# Patient Record
Sex: Female | Born: 2005 | Race: Black or African American | Hispanic: No | Marital: Single | Smoking: Former smoker
Health system: Southern US, Community
[De-identification: ages and names within clinical notes are randomized; demographics above are authoritative.]

## PROBLEM LIST (undated history)

## (undated) DIAGNOSIS — F32A Depression, unspecified: Secondary | ICD-10-CM

## (undated) DIAGNOSIS — R519 Headache, unspecified: Secondary | ICD-10-CM

## (undated) DIAGNOSIS — F988 Other specified behavioral and emotional disorders with onset usually occurring in childhood and adolescence: Secondary | ICD-10-CM

## (undated) DIAGNOSIS — F4310 Post-traumatic stress disorder, unspecified: Secondary | ICD-10-CM

## (undated) DIAGNOSIS — R45851 Suicidal ideations: Secondary | ICD-10-CM

## (undated) DIAGNOSIS — F329 Major depressive disorder, single episode, unspecified: Secondary | ICD-10-CM

---
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
## 2024-01-28 ENCOUNTER — Ambulatory Visit
Admission: EM | Admit: 2024-01-28 | Discharge: 2024-01-28 | Disposition: A | Discharge status: Home / Self Care | Payer: MEDICAID | Attending: Family Medicine | Admitting: Family Medicine

## 2024-01-28 DIAGNOSIS — J988 Other specified respiratory disorders: Secondary | ICD-10-CM | POA: Diagnosis not present

## 2024-01-28 DIAGNOSIS — Z1152 Encounter for screening for COVID-19: Secondary | ICD-10-CM

## 2024-01-28 DIAGNOSIS — B9789 Other viral agents as the cause of diseases classified elsewhere: Secondary | ICD-10-CM | POA: Diagnosis not present

## 2024-01-28 LAB — POC SOFIA SARS ANTIGEN FIA: SARS Coronavirus 2 Ag: NEGATIVE

## 2024-01-28 MED ORDER — PROMETHAZINE-DM 6.25-15 MG/5ML PO SYRP: 5.0000 mL | ORAL_SOLUTION | Freq: Three times a day (TID) | ORAL | 0 refills | Status: DC | PRN

## 2024-01-28 MED ORDER — PSEUDOEPHEDRINE HCL 30 MG PO TABS: 30.0000 mg | ORAL_TABLET | Freq: Three times a day (TID) | ORAL | 0 refills | Status: DC | PRN

## 2024-01-28 MED ORDER — CETIRIZINE HCL 10 MG PO TABS
10.0000 mg | ORAL_TABLET | Freq: Every day | ORAL | 0 refills | Status: DC
Start: 2024-01-28 — End: 2024-03-22

## 2024-01-28 MED ORDER — BENZONATATE 100 MG PO CAPS
100.0000 mg | ORAL_CAPSULE | Freq: Three times a day (TID) | ORAL | 0 refills | Status: DC | PRN
Start: 2024-01-28 — End: 2024-03-22

## 2024-01-28 MED ORDER — IBUPROFEN 600 MG PO TABS
600.0000 mg | ORAL_TABLET | Freq: Four times a day (QID) | ORAL | 0 refills | Status: DC | PRN
Start: 2024-01-28 — End: 2024-03-22

## 2024-01-28 NOTE — ED Provider Notes (Signed)
 [facility]  Note:  This document was prepared using Conservation officer, historic buildings and may include unintentional dictation errors.  MRN: [id number] DOB: May 09, 2005  Subjective:   [name] is a 18 y.o. female presenting for 3-day history of malaise, fatigue, loss of taste and smell, headaches, stuffy nose and ears, persistent coughing, neck tension.  No chest pain, shortness of breath or wheezing.  No asthma.  Smokes marijuana a few times weekly.  No immunocompromising conditions.  No current facility-administered medications for this encounter.  Current Outpatient Medications:    Pseudoephedrine-APAP-DM (DAYQUIL PO), Take by mouth., Disp: , Rfl:    Allergies  Allergen Reactions   Other Anaphylaxis    peanuts   Peanut-Containing Drug Products Anaphylaxis, Shortness Of Breath, Swelling and Other (See Comments)    Peanut butter    Past Medical History:  Diagnosis Date   ADD (attention deficit disorder)    Depression    Headache    PTSD (post-traumatic stress disorder)    Suicidal ideation      History reviewed. No pertinent surgical history.  Family History  Problem Relation Age of Onset   Depression Mother    Alcohol abuse Father    Hypertension Other    Diabetes Other    CAD Other     Social History   Tobacco Use   Smoking status: Former    Current packs/day: 0.25    Average packs/day: 0.3 packs/day for 3.3 years (0.8 ttl pk-yrs)    Types: Cigarettes    Start date: 09/26/2020   Smokeless tobacco: Never  Vaping Use   Vaping status: Former   Substances: Nicotine, Flavoring  Substance Use Topics   Alcohol use: Yes    Comment: occasionally   Drug use: Yes    Frequency: 3.0 times per week    Types: Marijuana    ROS   Objective:   Vitals: BP 128/84 (BP Location: Right Arm)   Pulse 92   Temp 98.3 F (36.8 C) (Oral)   Resp 16   LMP 01/11/2024 (Approximate)   SpO2 96%   Physical Exam Constitutional:      General:  She is not in acute distress.    Appearance: Normal appearance. She is well-developed and normal weight. She is not ill-appearing, toxic-appearing or diaphoretic.  HENT:     Head: Normocephalic and atraumatic.     Right Ear: Tympanic membrane, ear canal and external ear normal. No drainage or tenderness. No middle ear effusion. There is no impacted cerumen. Tympanic membrane is not erythematous or bulging.     Left Ear: Tympanic membrane, ear canal and external ear normal. No drainage or tenderness.  No middle ear effusion. There is no impacted cerumen. Tympanic membrane is not erythematous or bulging.     Nose: Congestion present. No rhinorrhea.     Mouth/Throat:     Mouth: Mucous membranes are moist. No oral lesions.     Pharynx: No pharyngeal swelling, oropharyngeal exudate, posterior oropharyngeal erythema or uvula swelling.     Tonsils: No tonsillar exudate or tonsillar abscesses.     Comments: Thick streaks of postnasal drainage overlying pharynx.  Eyes:     General: No scleral icterus.       Right eye: No discharge.        Left eye: No discharge.     Extraocular Movements: Extraocular movements intact.     Right eye: Normal extraocular motion.     Left eye: Normal extraocular motion.  Conjunctiva/sclera: Conjunctivae normal.  Cardiovascular:     Rate and Rhythm: Normal rate and regular rhythm.     Heart sounds: Normal heart sounds. No murmur heard.    No friction rub. No gallop.  Pulmonary:     Effort: Pulmonary effort is normal. No respiratory distress.     Breath sounds: No stridor. No wheezing, rhonchi or rales.  Chest:     Chest wall: No tenderness.  Musculoskeletal:     Cervical back: Normal range of motion and neck supple.  Lymphadenopathy:     Cervical: No cervical adenopathy.  Skin:    General: Skin is warm and dry.  Neurological:     General: No focal deficit present.     Mental Status: She is alert and oriented to person, place, and time.  Psychiatric:         Mood and Affect: Mood normal.        Behavior: Behavior normal.    Negative poc COVID 19.   Assessment and Plan :   PDMP not reviewed this encounter.  1. Viral respiratory infection   2. Encounter for screening for COVID-19    Deferred imaging given clear cardiopulmonary exam, hemodynamically stable vital signs.  Suspect viral URI, viral syndrome. Physical exam findings reassuring and vital signs stable for discharge. Advised supportive care, offered symptomatic relief. Counseled patient on potential for adverse effects with medications prescribed/recommended today, ER and return-to-clinic precautions discussed, patient verbalized understanding.     Christopher Savannah, [place] 01/28/24 8087

## 2024-01-28 NOTE — ED Triage Notes (Signed)
 Pt reports wants a COVID test, as she can smell or test x 2-3 days., Reports headache, tension in neck, stuffy eras, stuffy nose x 2-3 days. Dayquil, cough drops gives some relief.

## 2024-01-28 NOTE — Discharge Instructions (Addendum)
 We will manage this as a viral respiratory infection/illness. For sore throat or cough try using a honey-based tea. Use 3 teaspoons of honey with juice squeezed from half lemon. Place shaved pieces of ginger into 1/2-1 cup of water and warm over stove top. Then mix the ingredients and repeat every 4 hours as needed. Please take ibuprofen  600mg  every 6 hours with food alternating with OR taken together with Tylenol  500mg -650mg  every 6 hours for throat pain, fevers, aches and pains. Hydrate very well with at least 2 liters of water. Eat light meals such as soups (chicken and noodles, vegetable, chicken and wild rice).  Do not eat foods that you are allergic to.  Taking an antihistamine like Zyrtec (10mg  daily) can help against postnasal drainage, sinus congestion which can cause sinus pain, sinus headaches, throat pain, painful swallowing, coughing.  You can take this together with pseudoephedrine (Sudafed) at a dose of 30mg  3 times a day or twice daily as needed for the same kind of nasal drip, congestion.  Use cough medications as needed.

## 2024-02-28 ENCOUNTER — Ambulatory Visit
Admission: EM | Admit: 2024-02-28 | Discharge: 2024-02-28 | Disposition: A | Discharge status: Home / Self Care | Payer: MEDICAID | Attending: Family Medicine | Admitting: Family Medicine

## 2024-02-28 DIAGNOSIS — Z3201 Encounter for pregnancy test, result positive: Secondary | ICD-10-CM | POA: Diagnosis not present

## 2024-02-28 LAB — POCT URINE PREGNANCY: Preg Test, Ur: POSITIVE — AB

## 2024-02-28 NOTE — ED Provider Notes (Signed)
 [facility]    CSN: [id number] Arrival date & time: 02/28/24  1427      History   Chief Complaint Chief Complaint  Patient presents with   Possible Pregnancy    HPI [name] is a 18 y.o. female presents for menstrual issue.  Patient states her last menstrual period was September 14.  She is not on any birth control and is sexually active.  States she has been having some nausea, fatigue.  She has not been pregnant before.  States she did on positive home pregnancy test 2 to 3 days ago.  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 No pertinent surgical history.  OB History     Gravida  1   Para  0   Term  0   Preterm  0   AB  0   Living  0      SAB  0   IAB  0   Ectopic  0   Multiple  0   Live Births  0            Home Medications    Prior to Admission medications   Medication Sig Start Date End Date Taking? Authorizing Provider  benzonatate (TESSALON) 100 MG capsule Take 1 capsule (100 mg total) by mouth 3 (three) times daily as needed for cough. 01/28/24   Christopher Savannah, PA-C  cetirizine (ZYRTEC ALLERGY) 10 MG tablet Take 1 tablet (10 mg total) by mouth daily. 01/28/24   Christopher Savannah, PA-C  ibuprofen  (ADVIL ) 600 MG tablet Take 1 tablet (600 mg total) by mouth every 6 (six) hours as needed. 01/28/24   Christopher Savannah, PA-C  promethazine-dextromethorphan (PROMETHAZINE-DM) 6.25-15 MG/5ML syrup Take 5 mLs by mouth 3 (three) times daily as needed for cough. 01/28/24   Christopher Savannah, PA-C  pseudoephedrine (SUDAFED) 30 MG tablet Take 1 tablet (30 mg total) by mouth every 8 (eight) hours as needed for congestion. 01/28/24   Christopher Savannah, PA-C  Pseudoephedrine-APAP-DM (DAYQUIL PO) Take by mouth.    [provider]    Family History Family History  Problem Relation Age of Onset   Depression Mother    Alcohol abuse Father    Hypertension Other    Diabetes Other    CAD Other     Social History Social History   Tobacco Use   Smoking status: Former    Current packs/day: 0.25    Average packs/day: 0.3 packs/day for 3.4 years (0.9 ttl pk-yrs)    Types: Cigarettes    Start date: 09/26/2020   Smokeless tobacco: Never  Vaping Use   Vaping status: Former   Substances: Nicotine, Flavoring  Substance Use Topics   Alcohol use: Yes    Comment: occasionally   Drug use: Yes    Frequency: 3.0 times per week    Types: Marijuana     Allergies   Other and Peanut-containing drug products   Review of Systems Review of Systems  Genitourinary:  Positive for  menstrual problem.     Physical Exam Triage Vital Signs ED Triage Vitals  Encounter Vitals Group     BP 02/28/24 1445 114/64     Girls Systolic BP Percentile --      Girls Diastolic BP Percentile --      Boys Systolic BP Percentile --      Boys Diastolic BP Percentile --      Pulse Rate 02/28/24 1445 74     Resp 02/28/24 1445 16     Temp 02/28/24 1445 98.5 F (36.9 C)     Temp Source 02/28/24 1445 Oral     SpO2 02/28/24 1445 97 %     Weight --      Height --      Head Circumference --      Peak Flow --      Pain Score 02/28/24 1444 0     Pain Loc --      Pain Education --      Exclude from Growth Chart --    No data found.  Updated Vital Signs BP 114/64 (BP Location: Right Arm)   Pulse 74   Temp 98.5 F (36.9 C) (Oral)   Resp 16   LMP 01/10/2024 (Exact Date)   SpO2 97%   Visual Acuity Right Eye Distance:   Left Eye Distance:   Bilateral Distance:    Right Eye Near:   Left  Eye Near:    Bilateral Near:     Physical Exam Vitals and nursing note reviewed.  Constitutional:      Appearance: Normal appearance.  HENT:     Head: Normocephalic and atraumatic.  Eyes:     Pupils: Pupils are equal, round, and reactive to light.  Cardiovascular:     Rate and Rhythm: Normal rate.  Pulmonary:     Effort: Pulmonary effort is normal.  Skin:    General: Skin is warm and dry.  Neurological:     General: No focal deficit present.     Mental Status: She is alert and oriented to person, place, and time.  Psychiatric:        Mood and Affect: Mood normal.        Behavior: Behavior normal.      UC Treatments / Results  Labs (all labs ordered are listed, but only abnormal results are displayed) Labs Reviewed  POCT URINE PREGNANCY - Abnormal; Notable for the following components:      Result Value   Preg Test, Ur Positive (*)    All other components within normal limits    EKG   Radiology No results found.  Procedures Procedures (including critical care time)  Medications Ordered in UC Medications - No data to display  Initial Impression / Assessment and Plan / UC Course  I have reviewed the triage vital signs and the nursing notes.  Pertinent labs & imaging results that were available during my care of the patient were reviewed by me and considered in my medical decision making (see chart for details).     Positive urine hCG, reviewed with patient.  Advised to establish with OB/GYN for prenatal care.  Follow-up with PCP as needed. Final Clinical Impressions(s) / UC Diagnoses   Final diagnoses:  Positive urine pregnancy test     Discharge Instructions      Please establish with OB/GYN for your prenatal care.  Congratulations!    ED Prescriptions   None    PDMP not reviewed this encounter.   Latamara Melder, Jodi  R, NP 02/28/24 1507

## 2024-02-28 NOTE — ED Triage Notes (Signed)
 Pt requested pregnancy test. States she had a positive pregnancy 2-3 days ago. LMP 01/10/2024.

## 2024-02-28 NOTE — Discharge Instructions (Signed)
 Please establish with OB/GYN for your prenatal care.  Congratulations!

## 2024-03-15 ENCOUNTER — Ambulatory Visit: Payer: Self-pay

## 2024-03-22 ENCOUNTER — Telehealth: Payer: MEDICAID

## 2024-03-22 ENCOUNTER — Other Ambulatory Visit (HOSPITAL_COMMUNITY): Payer: Self-pay

## 2024-03-22 DIAGNOSIS — Z3A10 10 weeks gestation of pregnancy: Secondary | ICD-10-CM | POA: Diagnosis not present

## 2024-03-22 DIAGNOSIS — Z3490 Encounter for supervision of normal pregnancy, unspecified, unspecified trimester: Secondary | ICD-10-CM | POA: Insufficient documentation

## 2024-03-22 DIAGNOSIS — Z3491 Encounter for supervision of normal pregnancy, unspecified, first trimester: Secondary | ICD-10-CM | POA: Diagnosis not present

## 2024-03-22 MED ORDER — GOJJI WEIGHT SCALE MISC: 1.0000 | Freq: Once | 0 refills | Status: AC

## 2024-03-22 MED ORDER — PRENATAL PLUS VITAMIN/MINERAL 27-1 MG PO TABS
1.0000 | ORAL_TABLET | Freq: Every day | ORAL | 11 refills | Status: AC
  Filled 2024-03-22: qty 30, 30d supply, fill #0

## 2024-03-22 MED ORDER — BLOOD PRESSURE KIT DEVI: 1.0000 | Freq: Once | 0 refills | Status: AC
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
## 2024-03-22 NOTE — Patient Instructions (Signed)
 [facility] [address], [place], [place] [address] [phone] Hours: Sunday Closed Monday 9AM-6PM Tuesday 9AM-6PM Wednesday 9AM-6PM Thursday 9AM-6PM Friday           9AM-6PM Saturday         10 AM-1PM   Maternity Assessment Unit  [facility]  [address]  [place], [place]       We highly recommend childbirth education to help you plan for labor and begin practicing coping skills (which will be needed with or without pain meds).  [place] Childbirth Education Options: Sign up by visiting [email or web address]  Childbirth ~ Self-Paced eClass ([name] and Spanish) This online class offers you the freedom to complete a childbirth education series in the comfort of your own home at your own pace.  Childbirth Class (In-Person 4-Week Series  or on Saturdays, Virtual 4-Week Series ~ [place]) This interactive in-person class series will help you and your partner prepare for your birth experience. Topics include: Labor & Birth, Comfort Measures, Breathing Techniques, Massage, Medical Interventions, Pain Management Options, Cesarean Birth, Postpartum Care, and Newborn Care  Comfort Techniques for Labor ~ In-Person Class [facility]) This interactive class is designed for parents-to-be who want to learn & practice hands-on skills to help relieve some of the discomfort of labor and encourage their babies to rotate toward the best position for birth. Moms and their partners will be able to try a variety of labor positions with birth balls and rebozos as well as practice breathing, relaxation, and visualization techniques.  Natural Childbirth Class (In-Person 5-Week Series, In-Person on Saturdays or Virtual 5-Week Series ~ [place]) This class series is designed for expectant parents who want to learn and practice natural methods of coping with the process of labor and childbirth.  Cesarean Birth Self-Paced eClass ([name] and  Spanish) This online course provides comprehensive information you can trust as you prepare for a possible cesarean birth. In this class, you'll learn how to make your birth and recovery comfortable and joyful through instructive video clips, animations, and activities.  Waterbirth ~ Airline Pilot Interested in a waterbirth? In addition to a consultation with your credentialed waterbirth provider, this free, informational online class will help you discover whether waterbirth is the right fit for you. Not all obstetrical practices offer waterbirth, so check with your healthcare provider.  Tour Probation Officer) - [facility] [employer] our 4 minute video tour of [facility] located in [place].   [place] Parenting Education Options:  Pregnancy 101 (Virtual) Congratulations on your pregnancy! This class is geared toward moms in their first trimester, but everyone is welcome. We are excited to guide you through all aspects of supporting a healthy pregnancy. You will learn what to expect at routine prenatal care appointments, common postpartum adjustments, basic infant safety, and breastfeeding.  Successful Partnering & Parenting ~ In-Person Workshop [facility]) This workshop inspires and equips partners of all economic levels, ages, and cultures to confidently care for their infants, support the birthing persons, and navigate their own transformations into new partners and parents. Learning activities are geared towards supporting partner, but moms are welcome to attend.  'Baby & Me' Parenting Group (Virtual on Wednesdays at 11am) Enjoy this time discussing newborn & infant parenting topics and family adjustment issues with other new parents in a relaxed environment. Each week brings a new speaker or baby-centered activity. This group offers support and connection to parents as they journey through the  adjustments and struggles of that  sometimes overwhelming first year after the birth of a child.  Baby Safety, CPR, & Choking Class ~ Virtual This life-saving information is meant to encourage parents as they learn important safety and prevention tips as well as infant CPR and relief of choking.  Breastfeeding Class (In-Person in [place] or [employer]) Families learn what to expect in the first days and weeks of breastfeeding your newborn.  Breastfeeding Self-Paced eClass ([name] & Spanish) Families learn what to expect in the first days and weeks of breastfeeding your newborn.  Caring for Baby ~ In-Person, Virtual or Self-Paced Class This in-person class is for both expectant and adoptive parents who want to learn and practice the most up-to-date newborn care for their babies. Focus is on birth through the first six weeks of life.  CPR & Choking Relief for Infants & Children ~ In-Person Class [facility]) This in-person course is designed for any parent, expectant parent, or adult who cares for infants or children. Participants learn and demonstrate cardiopulmonary resuscitation and choking relief procedures for both infants and children.  Grandparent Love ~ In-Person Class Grandparents will learn the most updated infant care and safety recommendations. They will discover ways to support their own children during the transition into the parenting role and receive tips on communicating with the new parents.  [place] Parenting Support Group Options:  Bereavement Grief Support Group (Pregnancy/Infant Loss) - Virtual This is an ongoing experience that meets once a month and is designed to help you honor the past, assist you in discovering tools to strengthen you today, and aid you in developing hope for the future.  Breastfeeding & Pumping Support Group (In-Person on Thursdays at 12pm or Virtual on Tuesdays at 5pm) Join us  in-person each Thursday starting June 1st, 2023 at 12pm! This support group is free for all families  looking for breastfeeding and/or pumping support.   Community-Based Childbirth Education Options:  [facility] Department Classes:  Childbirth education classes can help you get ready for a positive parenting experience. You can also meet other expectant parents and get free stuff for your baby. Each class runs for five weeks on the same night and costs $45 for the mother-to-be and her support person. Medicaid covers the cost if you are eligible. Call [phone] to register.  [facility] offers a variety of programs for the [employer] and is another great way to get connected. Please go to [email or web address] for more information.  Childbirth With A Twist! Be informed of your options, get educated on birth, understand what your body is doing, learn how to cope, and have a lot of fun and laughs all while doing it either from the comfort of your couch OR in our cozy office and classroom space near the [place] airport. If you are taking a virtual class, then class is taught LIVE, so you can ask questions and receive answers in real-time from an experienced doula and childbirth educator.  This virtual childbirth education class will meet for five instruction times online.  Although we are based in [place], [place], this virtual class is open to anyone in the world. Please visit: [email or web address] for more information.  Books We Love: The Doula Guide to Childbirth by Retia Cook and Vernell Donald The First-Time Parent's Childbirth Handbook by Dr. Corean Glatter, CNM The Birth Partner by [employer]

## 2024-03-23 ENCOUNTER — Other Ambulatory Visit (INDEPENDENT_AMBULATORY_CARE_PROVIDER_SITE_OTHER): Payer: MEDICAID

## 2024-03-23 ENCOUNTER — Other Ambulatory Visit: Payer: Self-pay

## 2024-03-23 ENCOUNTER — Other Ambulatory Visit: Payer: Self-pay | Admitting: Obstetrics and Gynecology

## 2024-03-23 DIAGNOSIS — Z3490 Encounter for supervision of normal pregnancy, unspecified, unspecified trimester: Secondary | ICD-10-CM

## 2024-03-29 ENCOUNTER — Encounter: Payer: Self-pay | Admitting: Obstetrics and Gynecology

## 2024-03-31 ENCOUNTER — Other Ambulatory Visit: Payer: Self-pay

## 2024-04-11 ENCOUNTER — Other Ambulatory Visit (HOSPITAL_COMMUNITY)
Admission: RE | Admit: 2024-04-11 | Discharge: 2024-04-11 | Disposition: A | Payer: MEDICAID | Source: Ambulatory Visit | Attending: Obstetrics and Gynecology | Admitting: Obstetrics and Gynecology

## 2024-04-11 ENCOUNTER — Other Ambulatory Visit: Payer: MEDICAID

## 2024-04-11 ENCOUNTER — Other Ambulatory Visit: Payer: Self-pay

## 2024-04-11 DIAGNOSIS — Z3490 Encounter for supervision of normal pregnancy, unspecified, unspecified trimester: Secondary | ICD-10-CM

## 2024-04-11 NOTE — Addendum Note (Signed)
 Addended by: ELBY WADDELL CROME on: 04/11/2024 09:53 AM   Modules accepted: Orders

## 2024-04-12 LAB — CBC/D/PLT+RPR+RH+ABO+RUBIGG...
Antibody Screen: NEGATIVE
Basophils Absolute: 0 x10E3/uL (ref 0.0–0.2)
Basos: 0 %
EOS (ABSOLUTE): 0 x10E3/uL (ref 0.0–0.4)
Eos: 0 %
HCV Ab: NONREACTIVE
HIV Screen 4th Generation wRfx: NONREACTIVE
Hematocrit: 40 % (ref 34.0–46.6)
Hemoglobin: 13.1 g/dL (ref 11.1–15.9)
Hepatitis B Surface Ag: NEGATIVE
Immature Grans (Abs): 0 x10E3/uL (ref 0.0–0.1)
Immature Granulocytes: 0 %
Lymphocytes Absolute: 1.1 x10E3/uL (ref 0.7–3.1)
Lymphs: 15 %
MCH: 27.9 pg (ref 26.6–33.0)
MCHC: 32.8 g/dL (ref 31.5–35.7)
MCV: 85 fL (ref 79–97)
Monocytes Absolute: 0.3 x10E3/uL (ref 0.1–0.9)
Monocytes: 4 %
Neutrophils Absolute: 6.3 x10E3/uL (ref 1.4–7.0)
Neutrophils: 81 %
Platelets: 160 x10E3/uL (ref 150–450)
RBC: 4.7 x10E6/uL (ref 3.77–5.28)
RDW: 13.1 % (ref 11.7–15.4)
RPR Ser Ql: NONREACTIVE
Rh Factor: POSITIVE
Rubella Antibodies, IGG: 2.98 {index} (ref >0.99)
WBC: 7.8 x10E3/uL (ref 3.4–10.8)

## 2024-04-12 LAB — HEMOGLOBIN A1C
Est. average glucose Bld gHb Est-mCnc: 80 mg/dL
Hgb A1c MFr Bld: 4.4 % — ABNORMAL LOW (ref 4.8–5.6)

## 2024-04-12 LAB — GC/CHLAMYDIA PROBE AMP (~~LOC~~) NOT AT ARMC
Chlamydia: NEGATIVE
Comment: NEGATIVE
Comment: NORMAL
Neisseria Gonorrhea: NEGATIVE

## 2024-04-12 LAB — HCV INTERPRETATION

## 2024-04-13 LAB — CULTURE, OB URINE

## 2024-04-13 LAB — URINE CULTURE, OB REFLEX

## 2024-04-15 ENCOUNTER — Ambulatory Visit: Payer: Self-pay | Admitting: Obstetrics and Gynecology

## 2024-04-15 DIAGNOSIS — Z3490 Encounter for supervision of normal pregnancy, unspecified, unspecified trimester: Secondary | ICD-10-CM

## 2024-04-15 DIAGNOSIS — D563 Thalassemia minor: Secondary | ICD-10-CM

## 2024-04-16 LAB — PANORAMA PRENATAL TEST FULL PANEL:PANORAMA TEST PLUS 5 ADDITIONAL MICRODELETIONS: FETAL FRACTION: 9.9

## 2024-04-18 ENCOUNTER — Encounter: Payer: Self-pay | Admitting: General Practice

## 2024-04-24 LAB — HORIZON CUSTOM: REPORT SUMMARY: POSITIVE — AB

## 2024-04-25 DIAGNOSIS — D563 Thalassemia minor: Secondary | ICD-10-CM | POA: Insufficient documentation

## 2024-04-25 NOTE — Telephone Encounter (Addendum)
 Advised pt of horizon results positive for alpha thalaseemia carrier.  She would like to get a partner test kit at her next visit.  She opted to utilize [employer] counseling service, I provider her with their phone number for scheduling.  She had no further questions at this time.   Waddell, RN  ----- Message from Jerilynn Buddle, MD sent at 04/25/2024  1:24 PM EST ----- Horizon positive for alpha thal, offer FOB testing and genetic counseling

## 2024-05-11 ENCOUNTER — Ambulatory Visit: Payer: MEDICAID | Admitting: Obstetrics and Gynecology

## 2024-05-11 ENCOUNTER — Encounter: Payer: Self-pay | Admitting: Obstetrics and Gynecology

## 2024-05-11 VITALS — BP 114/73 | HR 91 | Wt 128.2 lb

## 2024-05-11 DIAGNOSIS — D563 Thalassemia minor: Secondary | ICD-10-CM

## 2024-05-11 DIAGNOSIS — Z3A17 17 weeks gestation of pregnancy: Secondary | ICD-10-CM

## 2024-05-11 DIAGNOSIS — F4329 Adjustment disorder with other symptoms: Secondary | ICD-10-CM | POA: Diagnosis not present

## 2024-05-11 DIAGNOSIS — Z3492 Encounter for supervision of normal pregnancy, unspecified, second trimester: Secondary | ICD-10-CM | POA: Diagnosis not present

## 2024-05-11 DIAGNOSIS — F32A Depression, unspecified: Secondary | ICD-10-CM | POA: Diagnosis not present

## 2024-05-11 DIAGNOSIS — Z23 Encounter for immunization: Secondary | ICD-10-CM

## 2024-05-11 NOTE — Progress Notes (Signed)
 "  INITIAL PRENATAL VISIT NOTE  Subjective:  [name] is a 19 y.o. G1P0000 at [redacted]w[redacted]d by LMP c/w early ultrasound being seen today for her initial prenatal visit. She has an no significant obstetric history. She has a medical history significant for ADD and depression.  Patient reports no complaints.   . Vag. Bleeding: None.   . Denies leaking of fluid.    Past Medical History:  Diagnosis Date   ADD (attention deficit disorder)    Depression    Headache    PTSD (post-traumatic stress disorder)    Suicidal ideation     History reviewed. No pertinent surgical history.  OB History  Gravida Para Term Preterm AB Living  1 0 0 0 0 0  SAB IAB Ectopic Multiple Live Births  0 0 0 0 0    # Outcome Date GA Lbr Len/2nd Weight Sex Type Anes PTL Lv  1 Current             Social History   Socioeconomic History   Marital status: Single    Spouse name: Not on file   Number of children: Not on file   Years of education: Not on file   Highest education level: 9th grade  Occupational History   Not on file  Tobacco Use   Smoking status: Former    Current packs/day: 0.25    Average packs/day: 0.3 packs/day for 3.6 years (0.9 ttl pk-yrs)    Types: Cigarettes    Start date: 09/26/2020   Smokeless tobacco: Never  Vaping Use   Vaping status: Former   Substances: Nicotine, Flavoring  Substance and Sexual Activity   Alcohol use: Not Currently    Comment: occasional wine   Drug use: Yes    Types: Marijuana   Sexual activity: Yes    Birth control/protection: None  Other Topics Concern   Not on file  Social History Narrative   Not on file   Social Drivers of Health   Tobacco Use: Medium Risk (03/22/2024)   Patient History    Smoking Tobacco Use: Former    Smokeless Tobacco Use: Never    Passive Exposure: Not on Actuary Strain: Not on file  Food Insecurity: Not on file  Transportation Needs: Not on file  Physical Activity: Not on file  Stress: Not on file   Social Connections: Not on file  Depression (PHQ2-9): High Risk (03/12/2023)   Depression (PHQ2-9)    PHQ-2 Score: 22  Alcohol Screen: Not on file  Housing: Not on file  Utilities: Not on file  Health Literacy: Not on file    Family History  Problem Relation Age of Onset   Depression Mother    Alcohol abuse Father    Hypertension Other    Diabetes Other    CAD Other     Current Medications[1]  Allergies[2]  Review of Systems: Negative except for what is mentioned in HPI.  Objective:   Vitals:   05/11/24 1507  BP: 114/73  Pulse: 91  Weight: 128 lb 3.2 oz (58.2 kg)    Fetal Status: Fetal Heart Rate (bpm): 148 Fundal Height: 18 cm       Physical Exam: BP 114/73   Pulse 91   Wt 128 lb 3.2 oz (58.2 kg)   LMP 01/10/2024 (Exact Date)   BMI 24.22 kg/m  CONSTITUTIONAL: Well-developed, well-nourished female in no acute distress.  NEUROLOGIC: Alert and oriented to person, place, and time. Normal reflexes, muscle tone coordination. No cranial  nerve deficit noted. PSYCHIATRIC: Normal mood and affect. Normal behavior. Normal judgment and thought content. SKIN: Skin is warm and dry. No rash noted. Not diaphoretic. No erythema. No pallor. HENT:  Normocephalic, atraumatic, External right and left ear normal. Oropharynx is clear and moist EYES: Conjunctivae and EOM are normal.  NECK: Normal range of motion, supple, no masses CARDIOVASCULAR: Normal heart rate noted, regular rhythm RESPIRATORY: Effort and breath sounds normal, no problems with respiration noted BREASTS: deferred ABDOMEN: Soft, nontender, nondistended, gravid. GU: n/a MUSCULOSKELETAL: Normal range of motion. EXT:  No edema and no tenderness. 2+ distal pulses.   Assessment and Plan:  Pregnancy: G1P0000 at [redacted]w[redacted]d by LMP  1. Encounter for supervision of low-risk pregnancy in second trimester (Primary) Continue routine prenatal care  - Flu vaccine trivalent PF, 6mos and  older(Flulaval,Afluria,Fluarix,Fluzone) - US  MFM OB DETAIL +14 WK; Future - AFP, Serum, Open Spina Bifida  2. Depression, unspecified depression type  - Ambulatory referral to Integrated Behavioral Health  3. [redacted] weeks gestation of pregnancy   4. Alpha thalassemia silent carrier FOB test kit given  5. Adjustment disorder with disturbance of emotion    Preterm labor symptoms and general obstetric precautions including but not limited to vaginal bleeding, contractions, leaking of fluid and fetal movement were reviewed in detail with the patient.  Please refer to After Visit Summary for other counseling recommendations.   No follow-ups on file. Pt will be scheduled for centering  [name] 05/11/2024 3:43 PM      [1]  Current Outpatient Medications:    Prenatal Vit-Fe Fumarate-FA (PRENATAL PLUS VITAMIN/MINERAL) 27-1 MG TABS, Take 1 tablet by mouth daily., Disp: 30 tablet, Rfl: 11 [2]  Allergies Allergen Reactions   Other Anaphylaxis    peanuts   Peanut-Containing Drug Products Anaphylaxis, Shortness Of Breath, Swelling and Other (See Comments)    Peanut butter   "

## 2024-05-11 NOTE — Progress Notes (Unsigned)
"     Integrated Behavioral Health via Telemedicine Visit  05/11/2024 [name] [id number]  Number of Integrated Behavioral Health Clinician visits: No data recorded Session Start time: No data recorded  Session End time: No data recorded Total time in minutes: No data recorded   Referring Provider: *** Patient/Family location: *** [facility] Provider location: *** All persons participating in visit: *** Types of Service: {CHL AMB TYPE OF SERVICE:[phone]}  I connected with [name] and/or [name]'s {family members:20773} via  Telephone or Engineer, Civil (consulting)  (Video is Surveyor, mining) and verified that I am speaking with the correct person using two identifiers. Discussed confidentiality: {YES/NO:21197}  I discussed the limitations of telemedicine and the availability of in person appointments.  Discussed there is a possibility of technology failure and discussed alternative modes of communication if that failure occurs.  I discussed that engaging in this telemedicine visit, they consent to the provision of behavioral healthcare and the services will be billed under their insurance.  Patient and/or legal guardian expressed understanding and consented to Telemedicine visit: {YES/NO:21197}  Presenting Concerns: Patient and/or family reports the following symptoms/concerns: *** Duration of problem: ***; Severity of problem: {Mild/Moderate/Severe:20260}  Patient and/or Family's Strengths/Protective Factors: {CHL AMB BH PROTECTIVE [phone]}  Goals Addressed: Patient will:  Reduce symptoms of: {IBH Symptoms:21014056}   Increase knowledge and/or ability of: {IBH Patient Tools:21014057}   Demonstrate ability to: {IBH Goals:21014053}  Progress towards Goals: {CHL AMB BH PROGRESS TOWARDS [phone]}    Interventions: Interventions utilized:  {IBH Interventions:21014054} Standardized Assessments completed: {IBH Screening  Tools:21014051}    Patient and/or Family Response: ***  Clinical Assessment/Diagnosis  No diagnosis found.    Assessment: Patient currently experiencing ***.   Patient may benefit from ***.  Plan: Follow up with behavioral health clinician on : *** Behavioral recommendations: *** Referral(s): {IBH Referrals:21014055}  I discussed the assessment and treatment plan with the patient and/or parent/guardian. They were provided an opportunity to ask questions and all were answered. They agreed with the plan and demonstrated an understanding of the instructions.   They were advised to call back or seek an in-person evaluation if the symptoms worsen or if the condition fails to improve as anticipated.  [name] C Cheryl Pusey, LCSW "

## 2024-05-11 NOTE — BH Specialist Note (Unsigned)
"     Integrated Behavioral Health via Telemedicine Visit  05/11/2024 [name] [id number]  Number of Integrated Behavioral Health Clinician visits: No data recorded Session Start time: No data recorded  Session End time: No data recorded Total time in minutes: No data recorded   Referring Provider: *** Patient/Family location: *** [facility] Provider location: *** All persons participating in visit: *** Types of Service: {CHL AMB TYPE OF SERVICE:[phone]}  I connected with [name] and/or [name]'s {family members:20773} via  Telephone or Engineer, Civil (consulting)  (Video is Surveyor, mining) and verified that I am speaking with the correct person using two identifiers. Discussed confidentiality: {YES/NO:21197}  I discussed the limitations of telemedicine and the availability of in person appointments.  Discussed there is a possibility of technology failure and discussed alternative modes of communication if that failure occurs.  I discussed that engaging in this telemedicine visit, they consent to the provision of behavioral healthcare and the services will be billed under their insurance.  Patient and/or legal guardian expressed understanding and consented to Telemedicine visit: {YES/NO:21197}  Presenting Concerns: Patient and/or family reports the following symptoms/concerns: *** Duration of problem: ***; Severity of problem: {Mild/Moderate/Severe:20260}  Patient and/or Family's Strengths/Protective Factors: {CHL AMB BH PROTECTIVE FACTORS:[phone]}  Goals Addressed: Patient will:  Reduce symptoms of: {IBH Symptoms:21014056}   Increase knowledge and/or ability of: {IBH Patient Tools:21014057}   Demonstrate ability to: {IBH Goals:21014053}  Progress towards Goals: {CHL AMB BH PROGRESS TOWARDS [phone]}    Interventions: Interventions utilized:  {IBH Interventions:21014054} Standardized Assessments completed: {IBH Screening  Tools:21014051}    Patient and/or Family Response: ***  Clinical Assessment/Diagnosis  No diagnosis found.    Assessment: Patient currently experiencing ***.   Patient may benefit from ***.  Plan: Follow up with behavioral health clinician on : *** Behavioral recommendations: *** Referral(s): {IBH Referrals:21014055}  I discussed the assessment and treatment plan with the patient and/or parent/guardian. They were provided an opportunity to ask questions and all were answered. They agreed with the plan and demonstrated an understanding of the instructions.   They were advised to call back or seek an in-person evaluation if the symptoms worsen or if the condition fails to improve as anticipated.  [name] Cheryl Parrow, LCSW     05/11/2024    3:55 PM 03/12/2023    1:00 PM  Depression screen PHQ 2/9  Decreased Interest 0 2  Down, Depressed, Hopeless 2 3  PHQ - 2 Score 2 5  Altered sleeping 2 2  Tired, decreased energy 3 3  Change in appetite 1 3  Feeling bad or failure about yourself  1 3  Trouble concentrating 0 2  Moving slowly or fidgety/restless 0 2  Suicidal thoughts 0 2  PHQ-9 Score 9 22      Data saved with a previous flowsheet row definition      05/11/2024    3:56 PM  GAD 7 : Generalized Anxiety Score  Nervous, Anxious, on Edge 1  Control/stop worrying 2  Worry too much - different things 2  Trouble relaxing 1  Restless 2  Easily annoyed or irritable 3  Afraid - awful might happen 2  Total GAD 7 Score 13     "

## 2024-05-12 ENCOUNTER — Institutional Professional Consult (permissible substitution): Payer: MEDICAID

## 2024-05-13 LAB — AFP, SERUM, OPEN SPINA BIFIDA
AFP MoM: 0.72
AFP Value: 32.8 ng/mL
Gest. Age on Collection Date: 17 wk
Maternal Age At EDD: 20.1 a
OSBR Risk 1 IN: 10000
Test Results:: NEGATIVE
Weight: 128 [lb_av]

## 2024-05-16 ENCOUNTER — Ambulatory Visit: Payer: Self-pay | Admitting: Obstetrics and Gynecology

## 2024-05-18 ENCOUNTER — Ambulatory Visit: Payer: MEDICAID

## 2024-05-18 ENCOUNTER — Ambulatory Visit: Payer: Self-pay | Admitting: Clinical

## 2024-05-18 DIAGNOSIS — Z91199 Patient's noncompliance with other medical treatment and regimen due to unspecified reason: Secondary | ICD-10-CM

## 2024-05-18 NOTE — BH Specialist Note (Signed)
 Pt did not arrive to video visit and did not answer the phone; Left HIPPA-compliant message to call back Warren from [employer] for [employer] at [facility] at  [phone] [facility] office).  ?; left MyChart message for patient.  ? ?

## 2024-05-18 NOTE — BH Specialist Note (Unsigned)
 Behavioral Health Treatment Plan   Name:[name]  [facility] Tailored Plan/ [place]  MRN: [id number]   Treatment Plan Development Date: 05/18/2024   Strengths: {BH Strengths:210964339}  Supports: {BH Supports:210964340}   Client Statement of Needs: ***   Treatment Level:***  Client Treatment Preferences:***   {BH Diagnoses:210964321}   Expected duration of treatment: ***  Party responsible for implementation of interventions: ***.   This plan has been reviewed and created by the following participants: ***   A new plan will be created at least every 12 months.  The patient fully participated in the development of treatment plan with the clinician and verbally consents to such treatment.   Patient Treatment Plan Signature Obtained: {BH Signature Options:[id number]}   Warren JAYSON Mering, LCSW

## 2024-05-20 ENCOUNTER — Other Ambulatory Visit: Payer: Self-pay

## 2024-05-20 ENCOUNTER — Ambulatory Visit: Payer: MEDICAID | Admitting: Family

## 2024-05-20 VITALS — BP 135/63 | HR 102 | Wt 137.4 lb

## 2024-05-20 DIAGNOSIS — Z3A18 18 weeks gestation of pregnancy: Secondary | ICD-10-CM

## 2024-05-20 DIAGNOSIS — Z3492 Encounter for supervision of normal pregnancy, unspecified, second trimester: Secondary | ICD-10-CM | POA: Diagnosis not present

## 2024-05-20 NOTE — Progress Notes (Signed)
"  °  [facility] PRENATAL VISIT NOTE- Centering Pregnancy Group #26  Subjective:  [name] is a 19 y.o. G1P0000 at [redacted]w[redacted]d being seen today for for session #1 for Centering Pregnancy..  She is currently monitored for the following issues for this low-risk pregnancy and has Severe recurrent major depression without psychotic features (HCC); PTSD (post-traumatic stress disorder); Suicide ideation; Adjustment disorder with disturbance of emotion; Supervision of low-risk pregnancy; and Alpha thalassemia silent carrier on their problem list.  Here with FOB Caleb who has voiced concerns with family members interfering with their relationship.    Patient reports no complaints.  . Vag. Bleeding: None.  Movement: Present. Denies leaking of fluid/ROM.   The following portions of the patient's history were reviewed and updated as appropriate: allergies, current medications, past family history, past medical history, past social history, past surgical history and problem list. Problem list updated.  Objective:    Fetal Status: Fetal Heart Rate (bpm): 148   Movement: Present     General:  Alert, oriented and cooperative. Patient is in no acute distress.  Skin: Skin is warm and dry. No rash noted.   Cardiovascular: Normal heart rate noted  Respiratory: Normal respiratory effort, no problems with respiration noted  Abdomen: Soft, gravid, appropriate for gestational age.  Pain/Pressure: Present     Pelvic: Cervical exam deferred        Extremities: Normal range of motion.     Mental Status: Normal mood and affect. Normal behavior. Normal judgment and thought content.   Assessment and Plan:  Pregnancy: G1P0000 at [redacted]w[redacted]d  1. Encounter for supervision of low-risk pregnancy in second trimester (Primary) - FOB will bring genetic screen to next visit   Early pregnancy warning signs and general obstetric precautions including but not limited to vaginal bleeding, contractions, leaking of  fluid and fetal movement were reviewed in detail with the patient. Please refer to After Visit Summary for other counseling recommendations.   Centering Pregnancy, Session#1: Introduction to model of care. Group determined rules for self-governance and closing phrase. Oriented group to space and mother's notebook.   Facilitated discussion today:  Common discomforts of pregnancy  Mindfulness activity completed as well as introduction to deep breathing for childbirth preparation.    Fundal height and FHR appropriate today unless noted otherwise in plan.   Return in about 4 weeks (around 06/17/2024) for Centering Group (Scheduled).  Future Appointments  Date Time Provider Department Center  06/13/2024 11:15 AM [facility] PROVIDER 1 [facility]  06/13/2024 11:30 AM [facility] US4 [facility]  06/17/2024  9:00 AM CENTERING PROVIDER [facility]  07/15/2024  9:00 AM CENTERING PROVIDER [facility]  08/12/2024  9:00 AM CENTERING PROVIDER [facility]  08/26/2024  9:00 AM CENTERING PROVIDER [facility]  09/09/2024  9:00 AM CENTERING PROVIDER [facility]  09/23/2024  9:00 AM CENTERING PROVIDER [facility]  10/07/2024  9:00 AM CENTERING PROVIDER [facility]  10/21/2024  9:00 AM CENTERING PROVIDER [facility]    Tjopijy LOISE Pouch, CNM  "

## 2024-05-20 NOTE — Patient Instructions (Signed)

## 2024-06-13 ENCOUNTER — Other Ambulatory Visit: Payer: MEDICAID
